# Patient Record
Sex: Male | Born: 1995 | Race: White | Hispanic: No | Marital: Single | Smoking: Current every day smoker
Health system: Southern US, Community
[De-identification: ages and names within clinical notes are randomized; demographics above are authoritative.]

## PROBLEM LIST (undated history)

## (undated) DIAGNOSIS — F419 Anxiety disorder, unspecified: Secondary | ICD-10-CM

## (undated) DIAGNOSIS — T7840XA Allergy, unspecified, initial encounter: Secondary | ICD-10-CM

## (undated) HISTORY — DX: Allergy, unspecified, initial encounter: T78.40XA

## (undated) HISTORY — PX: INNER EAR SURGERY: SHX679

## (undated) HISTORY — DX: Anxiety disorder, unspecified: F41.9

---
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
## 2017-03-22 DIAGNOSIS — K5909 Other constipation: Secondary | ICD-10-CM | POA: Diagnosis not present

## 2017-03-22 DIAGNOSIS — K625 Hemorrhage of anus and rectum: Secondary | ICD-10-CM | POA: Diagnosis not present

## 2018-01-03 ENCOUNTER — Ambulatory Visit
Admission: RE | Admit: 2018-01-03 | Discharge: 2018-01-03 | Disposition: A | Discharge status: Home / Self Care | Payer: BLUE CROSS/BLUE SHIELD | Source: Ambulatory Visit | Attending: Student | Admitting: Student

## 2018-01-03 ENCOUNTER — Other Ambulatory Visit: Payer: Self-pay | Admitting: Student

## 2018-01-03 DIAGNOSIS — K5900 Constipation, unspecified: Secondary | ICD-10-CM

## 2018-02-06 ENCOUNTER — Encounter: Payer: Self-pay | Admitting: Physician Assistant

## 2018-02-06 ENCOUNTER — Ambulatory Visit (INDEPENDENT_AMBULATORY_CARE_PROVIDER_SITE_OTHER): Payer: BLUE CROSS/BLUE SHIELD | Admitting: Physician Assistant

## 2018-02-06 VITALS — BP 130/100 | HR 80 | Temp 98.1°F | Resp 20 | Ht 71.0 in | Wt 227.0 lb

## 2018-02-06 DIAGNOSIS — Z9109 Other allergy status, other than to drugs and biological substances: Secondary | ICD-10-CM | POA: Insufficient documentation

## 2018-02-06 DIAGNOSIS — F419 Anxiety disorder, unspecified: Secondary | ICD-10-CM | POA: Insufficient documentation

## 2018-02-06 DIAGNOSIS — G43109 Migraine with aura, not intractable, without status migrainosus: Secondary | ICD-10-CM | POA: Insufficient documentation

## 2018-02-06 DIAGNOSIS — K5901 Slow transit constipation: Secondary | ICD-10-CM | POA: Insufficient documentation

## 2018-02-06 DIAGNOSIS — R030 Elevated blood-pressure reading, without diagnosis of hypertension: Secondary | ICD-10-CM | POA: Insufficient documentation

## 2018-02-06 NOTE — Progress Notes (Signed)
Patient: Bradley Webb, Male    DOB: 06/25/1995, 22 y.o.   MRN: [id number] Visit Date: 02/06/2018  Today's Provider: Margaretann LovelessJennifer M Cj Beecher, PA-C   Chief Complaint  Patient presents with  . New Patient (Initial Visit)   Subjective:    Annual physical exam Bradley Webb is a 22 y.o. male who presents today to establish care. [facility] His mother is a patient here.   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 There was immense scarring and he has had multiple ear surgeries and attempted grafts twice that failed. He is still followed by audiology prn.  He continues to have issues with constipation. This causes a lot of his abdominal pain, bloating and occasional rectal bleeding when severely constipated. He has seen GI in the past for this. He is currently using colace daily, miralax prn.   He also complains of occasional headaches. They occur approximately once per month. He describes them as sharp, intense pain over the right eye. They are preceeded by flashes of light in the right eye. He also describes occasional nausea and dizziness with them. He is able to take something OTC for them and gets relief.   He also suffers from environmental allergies. They do effect him year round. They cause eye itching, sinus pressure, rhinorrhea, sneezing and cough. He will take something OTC intermittently.  He also does have anxiety. This has been treated and is stable currently. He is on Lexapro 5mg . No complaints.  He does smoke cigarettes, about 1/2 ppd. He also drinks alcohol. Reports to drinking 1-2 beers every other night. He does not use illicit drugs.  -----------------------------------------------------------------   Review of Systems  Constitutional: Positive for diaphoresis.  HENT: Positive for congestion, hearing loss and sinus pressure.   Eyes:  Positive for photophobia and itching.  Respiratory: Positive for cough and shortness of breath.   Cardiovascular: Negative.   Gastrointestinal: Positive for abdominal distention, abdominal pain, anal bleeding, blood in stool, constipation, nausea and rectal pain.  Endocrine: Positive for polydipsia and polyuria.  Genitourinary: Negative.   Musculoskeletal: Positive for back pain.  Skin: Negative.   Allergic/Immunologic: Positive for environmental allergies.  Neurological: Positive for dizziness, light-headedness, numbness and headaches.  Hematological: Negative.   Psychiatric/Behavioral: Positive for agitation, behavioral problems and dysphoric mood. The patient is nervous/anxious.     Social History      He  reports that he has been smoking cigarettes. He has been smoking about 0.50 packs per day. He has never used smokeless tobacco. He reports that he drinks about 9.0 standard drinks of alcohol per week. He reports that he has current or past drug history.       Social History   Socioeconomic History  . Marital status: Single    Spouse name: Not on file  . Number of children: Not on file  . Years of education: Not on file  . Highest education level: Not on file  Occupational History  . Not on file  Social Needs  . Financial resource strain: Not on file  . Food insecurity:    Worry: Not on file    Inability: Not on file  . Transportation needs:    Medical: Not on file    Non-medical: Not on file  Tobacco Use  . Smoking status: Current Every Day Smoker    Packs/day:  0.50    Types: Cigarettes  . Smokeless tobacco: Never Used  Substance and Sexual Activity  . Alcohol use: Yes    Alcohol/week: 9.0 standard drinks    Types: 9 Cans of beer per week  . Drug use: Yes  . Sexual activity: Not on file  Lifestyle  . Physical activity:    Days per week: Not on file    Minutes per session: Not on file  . Stress: Not on file  Relationships  . Social connections:    Talks on  phone: Not on file    Gets together: Not on file    Attends religious service: Not on file    Active member of club or organization: Not on file    Attends meetings of clubs or organizations: Not on file    Relationship status: Not on file  Other Topics Concern  . Not on file  Social History Narrative  . Not on file    Past Medical History:  Diagnosis Date  . Allergy   . Anxiety      There are no active problems to display for this patient.   Past Surgical History:  Procedure Laterality Date  . INNER EAR SURGERY      Family History        Family Status  Relation Name Status  . Mother  Alive        His family history includes Anxiety disorder in his mother; Depression in his mother; Hypertension in his mother.      Allergies  Allergen Reactions  . Codeine      Current Outpatient Medications:  .  docusate sodium (COLACE) 100 MG capsule, Take by mouth., Disp: , Rfl:  .  escitalopram (LEXAPRO) 5 MG tablet, TAKE 1 TABLET BY MOUTH EVERY DAY, Disp: , Rfl:  .  ibuprofen (ADVIL,MOTRIN) 800 MG tablet, Take 1 tablet (800 mg total) by mouth every 8 (eight) hours as needed for moderate pain., Disp: 21 tablet, Rfl: 0   Patient Care Team: Margaretann LovelessBurnette, Schelly Chuba M, PA-C as PCP - General (Family Medicine)      Objective:   Vitals: BP (!) 130/100 (BP Location: Left Arm, Patient Position: Sitting, Cuff Size: Large)   Pulse 80   Temp 98.1 F (36.7 C) (Oral)   Resp 20   Ht 5\' 11"  (1.803 m)   Wt 227 lb (103 kg)   SpO2 98%   BMI 31.66 kg/m    Vitals:   02/06/18 1442  BP: (!) 130/100  Pulse: 80  Resp: 20  Temp: 98.1 F (36.7 C)  TempSrc: Oral  SpO2: 98%  Weight: 227 lb (103 kg)  Height: 5\' 11"  (1.803 m)     Physical Exam  Constitutional: He is oriented to person, place, and time. He appears well-developed and well-nourished.  HENT:  Head: Normocephalic and atraumatic.  Right Ear: Hearing, tympanic membrane, external ear and ear canal normal.  Left Ear: External  ear normal. Tympanic membrane is scarred. Decreased hearing is noted.  Nose: Nose normal.  Mouth/Throat: Uvula is midline, oropharynx is clear and moist and mucous membranes are normal.  Eyes: Pupils are equal, round, and reactive to light. Conjunctivae and EOM are normal. Right eye exhibits no discharge.  Neck: Normal range of motion. Neck supple. No tracheal deviation present. No thyromegaly present.  Cardiovascular: Normal rate, regular rhythm, normal heart sounds and intact distal pulses.  No murmur heard. Pulmonary/Chest: Effort normal and breath sounds normal. No respiratory distress. He has no wheezes. He  has no rales. He exhibits no tenderness.  Abdominal: Soft. He exhibits no distension and no mass. There is no tenderness. There is no rebound and no guarding.  Musculoskeletal: Normal range of motion. He exhibits no edema or tenderness.  Lymphadenopathy:    He has no cervical adenopathy.  Neurological: He is alert and oriented to person, place, and time. He has normal reflexes. He displays normal reflexes. No cranial nerve deficit. He exhibits normal muscle tone. Coordination normal.  Skin: Skin is warm and dry. No rash noted. No erythema.  Psychiatric: He has a normal mood and affect. His behavior is normal. Judgment and thought content normal.     Depression Screen PHQ 2/9 Scores 02/06/2018  PHQ - 2 Score 2  PHQ- 9 Score 2      Assessment & Plan:     Routine Health Maintenance and Physical Exam  Exercise Activities and Dietary recommendations Goals   None      There is no immunization history on file for this patient.  Health Maintenance  Topic Date Due  . HIV Screening  07/27/2012  . TETANUS/TDAP  07/27/2016  . INFLUENZA VACCINE  01/17/2018     Discussed health benefits of physical activity, and encouraged him to engage in regular exercise appropriate for his age and condition.    1. Elevated blood pressure reading BP today is elevated. Possibly due to  meeting new provider. Mother has BP issues. I will see him back in 4 weeks to recheck BP.   2. Slow transit constipation Continue colace. Discussed dietary fibers and probiotics as well. Use miralax prn. Call if worsening. Discussed medications such as Linzess, amitiza, etc.  3. Migraine with aura and without status migrainosus, not intractable Able to break with OTC medications. Discussed magnesium oxide 1000 mg daily. May help constipation as well as migraines. Call if headaches worsen or become more frequent.   4. Environmental allergies Continue OTC treatment.   5. Anxiety Stable on Lexapro 5mg .   --------------------------------------------------------------------    Margaretann Loveless, PA-C  [facility]

## 2018-02-06 NOTE — Patient Instructions (Signed)

## 2018-03-19 ENCOUNTER — Encounter: Payer: Self-pay | Admitting: Physician Assistant

## 2018-03-19 ENCOUNTER — Ambulatory Visit (INDEPENDENT_AMBULATORY_CARE_PROVIDER_SITE_OTHER): Payer: BLUE CROSS/BLUE SHIELD | Admitting: Physician Assistant

## 2018-03-19 VITALS — BP 130/90 | HR 82 | Temp 98.5°F | Resp 16 | Ht 71.0 in | Wt 230.0 lb

## 2018-03-19 DIAGNOSIS — I10 Essential (primary) hypertension: Secondary | ICD-10-CM

## 2018-03-19 DIAGNOSIS — Z2821 Immunization not carried out because of patient refusal: Secondary | ICD-10-CM

## 2018-03-19 DIAGNOSIS — Z6832 Body mass index (BMI) 32.0-32.9, adult: Secondary | ICD-10-CM

## 2018-03-19 MED ORDER — LISINOPRIL 10 MG PO TABS: 10.0000 mg | ORAL_TABLET | Freq: Every day | ORAL | 1 refills | Status: DC

## 2018-03-19 NOTE — Progress Notes (Signed)
       Patient: Bradley Webb Male    DOB: [birth year]   22 y.o.   MRN: [id number] Visit Date: 03/19/2018  Today's Provider: Margaretann Loveless, PA-C   Chief Complaint  Patient presents with  . Follow-up    Elevated Blood Pressure.   Subjective:    HPI Patient here today for 4 week follow-up. Patient last office visit was 130/100. Patient reports that he stopped the energy drinks completely. Diet in general is "unhealthy". Reports exercising. Patient denies chest pain, leg swelling, visual disturbance, dizziness or lightheaded.    Allergies  Allergen Reactions  . Codeine      Current Outpatient Medications:  .  docusate sodium (COLACE) 100 MG capsule, Take by mouth., Disp: , Rfl:  .  escitalopram (LEXAPRO) 5 MG tablet, TAKE 1 TABLET BY MOUTH EVERY DAY, Disp: , Rfl:  .  ibuprofen (ADVIL,MOTRIN) 800 MG tablet, Take 1 tablet (800 mg total) by mouth every 8 (eight) hours as needed for moderate pain. (Patient not taking: Reported on 03/19/2018), Disp: 21 tablet, Rfl: 0  Review of Systems  Constitutional: Negative for fatigue.  Eyes: Negative for visual disturbance.  Respiratory: Negative for chest tightness and shortness of breath.   Cardiovascular: Negative for chest pain, palpitations and leg swelling.  Neurological: Negative for dizziness, light-headedness and headaches.    Social History   Tobacco Use  . Smoking status: Current Every Day Smoker    Packs/day: 0.50    Types: Cigarettes  . Smokeless tobacco: Never Used  Substance Use Topics  . Alcohol use: Yes    Alcohol/week: 9.0 standard drinks    Types: 9 Cans of beer per week   Objective:   BP 130/90 (BP Location: Right Arm, Patient Position: Sitting, Cuff Size: Large)   Pulse 82   Temp 98.5 F (36.9 C) (Oral)   Resp 16   Ht 5\' 11"  (1.803 m)   Wt 230 lb (104.3 kg)   BMI 32.08 kg/m  Vitals:   03/19/18 1304  BP: 130/90  Pulse: 82  Resp: 16  Temp: 98.5 F (36.9 C)  TempSrc: Oral  Weight: 230 lb (104.3  kg)  Height: 5\' 11"  (1.803 m)     Physical Exam  Constitutional: He appears well-developed and well-nourished. No distress.  HENT:  Head: Normocephalic and atraumatic.  Neck: Normal range of motion. Neck supple.  Cardiovascular: Normal rate, regular rhythm and normal heart sounds. Exam reveals no gallop and no friction rub.  No murmur heard. Pulmonary/Chest: Effort normal and breath sounds normal. No respiratory distress. He has no wheezes. He has no rales.  Musculoskeletal: He exhibits no edema.  Skin: He is not diaphoretic.  Vitals reviewed.       Assessment & Plan:     1. Essential hypertension Will start lisinopril as below for HTN. I will see him back in 4-6 weeks to see how he is doing with the medication.  - lisinopril (PRINIVIL,ZESTRIL) 10 MG tablet; Take 1 tablet (10 mg total) by mouth daily.  Dispense: 30 tablet; Refill: 1  2. BMI 32.0-32.9,adult Counseled patient on healthy lifestyle modifications including dieting and exercise.   3. Influenza vaccination declined       Margaretann Loveless, PA-C  [facility]

## 2018-03-19 NOTE — Patient Instructions (Signed)
Hypertension Hypertension is another name for high blood pressure. High blood pressure forces your heart to work harder to pump blood. This can cause problems over time. There are two numbers in a blood pressure reading. There is a top number (systolic) over a bottom number (diastolic). It is best to have a blood pressure below 120/80. Healthy choices can help lower your blood pressure. You may need medicine to help lower your blood pressure if:  Your blood pressure cannot be lowered with healthy choices.  Your blood pressure is higher than 130/80.  Follow these instructions at home: Eating and drinking  If directed, follow the DASH eating plan. This diet includes: ? Filling half of your plate at each meal with fruits and vegetables. ? Filling one quarter of your plate at each meal with whole grains. Whole grains include whole wheat pasta, brown rice, and whole grain bread. ? Eating or drinking low-fat dairy products, such as skim milk or low-fat yogurt. ? Filling one quarter of your plate at each meal with low-fat (lean) proteins. Low-fat proteins include fish, skinless chicken, eggs, beans, and tofu. ? Avoiding fatty meat, cured and processed meat, or chicken with skin. ? Avoiding premade or processed food.  Eat less than 1,500 mg of salt (sodium) a day.  Limit alcohol use to no more than 1 drink a day for nonpregnant women and 2 drinks a day for men. One drink equals 12 oz of beer, 5 oz of wine, or 1 oz of hard liquor. Lifestyle  Work with your doctor to stay at a healthy weight or to lose weight. Ask your doctor what the best weight is for you.  Get at least 30 minutes of exercise that causes your heart to beat faster (aerobic exercise) most days of the week. This may include walking, swimming, or biking.  Get at least 30 minutes of exercise that strengthens your muscles (resistance exercise) at least 3 days a week. This may include lifting weights or pilates.  Do not use any  products that contain nicotine or tobacco. This includes cigarettes and e-cigarettes. If you need help quitting, ask your doctor.  Check your blood pressure at home as told by your doctor.  Keep all follow-up visits as told by your doctor. This is important. Medicines  Take over-the-counter and prescription medicines only as told by your doctor. Follow directions carefully.  Do not skip doses of blood pressure medicine. The medicine does not work as well if you skip doses. Skipping doses also puts you at risk for problems.  Ask your doctor about side effects or reactions to medicines that you should watch for. Contact a doctor if:  You think you are having a reaction to the medicine you are taking.  You have headaches that keep coming back (recurring).  You feel dizzy.  You have swelling in your ankles.  You have trouble with your vision. Get help right away if:  You get a very bad headache.  You start to feel confused.  You feel weak or numb.  You feel faint.  You get very bad pain in your: ? Chest. ? Belly (abdomen).  You throw up (vomit) more than once.  You have trouble breathing. Summary  Hypertension is another name for high blood pressure.  Making healthy choices can help lower blood pressure. If your blood pressure cannot be controlled with healthy choices, you may need to take medicine. This information is not intended to replace advice given to you by your health care   provider. Make sure you discuss any questions you have with your health care provider. Document Released: 11/22/2007 Document Revised: 05/03/2016 Document Reviewed: 05/03/2016 Elsevier Interactive Patient Education  2018 [employer]. Lisinopril tablets What is this medicine? LISINOPRIL (lyse IN oh pril) is an ACE inhibitor. This medicine is used to treat high blood pressure and heart failure. It is also used to protect the heart immediately after a heart attack. This medicine may be used  for other purposes; ask your health care provider or pharmacist if you have questions. COMMON BRAND NAME(S): Prinivil, Zestril What should I tell my health care provider before I take this medicine? They need to know if you have any of these conditions: -diabetes -heart or blood vessel disease -kidney disease -low blood pressure -previous swelling of the tongue, face, or lips with difficulty breathing, difficulty swallowing, hoarseness, or tightening of the throat -an unusual or allergic reaction to lisinopril, other ACE inhibitors, insect venom, foods, dyes, or preservatives -pregnant or trying to get pregnant -breast-feeding How should I use this medicine? Take this medicine by mouth with a glass of water. Follow the directions on your prescription label. You may take this medicine with or without food. If it upsets your stomach, take it with food. Take your medicine at regular intervals. Do not take it more often than directed. Do not stop taking except on your doctor's advice. Talk to your pediatrician regarding the use of this medicine in children. Special care may be needed. While this drug may be prescribed for children as young as 47 years of age for selected conditions, precautions do apply. Overdosage: If you think you have taken too much of this medicine contact a poison control center or emergency room at once. NOTE: This medicine is only for you. Do not share this medicine with others. What if I miss a dose? If you miss a dose, take it as soon as you can. If it is almost time for your next dose, take only that dose. Do not take double or extra doses. What may interact with this medicine? Do not take this medicine with any of the following medications: -hymenoptera venom -sacubitril; valsartan This medicines may also interact with the following medications: -aliskiren -angiotensin receptor blockers, like losartan or valsartan -certain medicines for  diabetes -diuretics -everolimus -gold compounds -lithium -NSAIDs, medicines for pain and inflammation, like ibuprofen or naproxen -potassium salts or supplements -salt substitutes -sirolimus -temsirolimus This list may not describe all possible interactions. Give your health care provider a list of all the medicines, herbs, non-prescription drugs, or dietary supplements you use. Also tell them if you smoke, drink alcohol, or use illegal drugs. Some items may interact with your medicine. What should I watch for while using this medicine? Visit your doctor or health care professional for regular check ups. Check your blood pressure as directed. Ask your doctor what your blood pressure should be, and when you should contact him or her. Do not treat yourself for coughs, colds, or pain while you are using this medicine without asking your doctor or health care professional for advice. Some ingredients may increase your blood pressure. Women should inform their doctor if they wish to become pregnant or think they might be pregnant. There is a potential for serious side effects to an unborn child. Talk to your health care professional or pharmacist for more information. Check with your doctor or health care professional if you get an attack of severe diarrhea, nausea and vomiting, or if you sweat a  lot. The loss of too much body fluid can make it dangerous for you to take this medicine. You may get drowsy or dizzy. Do not drive, use machinery, or do anything that needs mental alertness until you know how this drug affects you. Do not stand or sit up quickly, especially if you are an older patient. This reduces the risk of dizzy or fainting spells. Alcohol can make you more drowsy and dizzy. Avoid alcoholic drinks. Avoid salt substitutes unless you are told otherwise by your doctor or health care professional. What side effects may I notice from receiving this medicine? Side effects that you should report  to your doctor or health care professional as soon as possible: -allergic reactions like skin rash, itching or hives, swelling of the hands, feet, face, lips, throat, or tongue -breathing problems -signs and symptoms of kidney injury like trouble passing urine or change in the amount of urine -signs and symptoms of increased potassium like muscle weakness; chest pain; or fast, irregular heartbeat -signs and symptoms of liver injury like dark yellow or brown urine; general ill feeling or flu-like symptoms; light-colored stools; loss of appetite; nausea; right upper belly pain; unusually weak or tired; yellowing of the eyes or skin -signs and symptoms of low blood pressure like dizziness; feeling faint or lightheaded, falls; unusually weak or tired -stomach pain with or without nausea and vomiting Side effects that usually do not require medical attention (report to your doctor or health care professional if they continue or are bothersome): -changes in taste -cough -dizziness -fever -headache -sensitivity to light This list may not describe all possible side effects. Call your doctor for medical advice about side effects. You may report side effects to FDA at 1-800-FDA-1088. Where should I keep my medicine? Keep out of the reach of children. Store at room temperature between 15 and 30 degrees C (59 and 86 degrees F). Protect from moisture. Keep container tightly closed. Throw away any unused medicine after the expiration date. NOTE: This sheet is a summary. It may not cover all possible information. If you have questions about this medicine, talk to your doctor, pharmacist, or health care provider.  2018 Elsevier/Gold Standard (2015-07-26 12:52:35)
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
## 2018-04-03 ENCOUNTER — Other Ambulatory Visit: Payer: Self-pay | Admitting: Physician Assistant

## 2018-04-03 DIAGNOSIS — I10 Essential (primary) hypertension: Secondary | ICD-10-CM

## 2018-04-03 MED ORDER — LISINOPRIL 10 MG PO TABS: 10.0000 mg | ORAL_TABLET | Freq: Every day | ORAL | 1 refills | Status: DC

## 2018-04-03 NOTE — Addendum Note (Signed)
Addended by: Margaretann Loveless on: 04/03/2018 11:48 AM   Modules accepted: Orders

## 2018-05-01 ENCOUNTER — Ambulatory Visit: Payer: Self-pay | Admitting: Physician Assistant

## 2018-05-01 NOTE — Progress Notes (Deleted)
       Patient: Bradley Webb Male    DOB: 03/10/1996   22 y.o.   MRN: [id number] Visit Date: 05/01/2018  Today's Provider: Margaretann LovelessJennifer M Burnette, PA-C   No chief complaint on file.  Subjective:    HPI  Hypertension, follow-up:  BP Readings from Last 3 Encounters:  03/19/18 130/90  02/06/18 (!) 130/100  10/08/15 126/76    He was last seen for hypertension 6 weeks ago.  BP at that visit was 130/100. Management since that visit includes start Lisinopril. He reports {excellent/good/fair/poor:19665} compliance with treatment. He {ACTION; IS/IS [id number]} having side effects. *** He {is/is not:9024} exercising. He {is/is not:9024} adherent to low salt diet.   Outside blood pressures are ***. He is experiencing {Symptoms; cardiac:12860}.  Patient denies {Symptoms; cardiac:12860}.   Cardiovascular risk factors include {cv risk factors:510}.  Use of agents associated with hypertension: {bp agents assoc with hypertension:511::"none"}.     Weight trend: {trend:16658} Wt Readings from Last 3 Encounters:  03/19/18 230 lb (104.3 kg)  02/06/18 227 lb (103 kg)  10/08/15 260 lb (117.9 kg) (>99 %, Z= 2.60)*   * Growth percentiles are based on CDC (Boys, 2-20 Years) data.    Current diet: {diet habits:16563}  ------------------------------------------------------------------------     Allergies  Allergen Reactions  . Codeine      Current Outpatient Medications:  .  docusate sodium (COLACE) 100 MG capsule, Take by mouth., Disp: , Rfl:  .  ibuprofen (ADVIL,MOTRIN) 800 MG tablet, Take 1 tablet (800 mg total) by mouth every 8 (eight) hours as needed for moderate pain. (Patient not taking: Reported on 03/19/2018), Disp: 21 tablet, Rfl: 0 .  lisinopril (PRINIVIL,ZESTRIL) 10 MG tablet, Take 1 tablet (10 mg total) by mouth daily., Disp: 90 tablet, Rfl: 1  Review of Systems  Social History   Tobacco Use  . Smoking status: Current Every Day Smoker    Packs/day: 0.50   Types: Cigarettes  . Smokeless tobacco: Never Used  Substance Use Topics  . Alcohol use: Yes    Alcohol/week: 9.0 standard drinks    Types: 9 Cans of beer per week   Objective:   There were no vitals taken for this visit. There were no vitals filed for this visit.   Physical Exam      Assessment & Plan:           Margaretann LovelessJennifer M Burnette, PA-C  [facility]

## 2018-08-07 ENCOUNTER — Encounter: Payer: Self-pay | Admitting: Physician Assistant

## 2018-08-07 ENCOUNTER — Ambulatory Visit (INDEPENDENT_AMBULATORY_CARE_PROVIDER_SITE_OTHER): Payer: Self-pay | Admitting: Physician Assistant

## 2018-08-07 VITALS — BP 118/88 | HR 92 | Temp 98.4°F | Resp 16 | Wt 229.6 lb

## 2018-08-07 DIAGNOSIS — H60392 Other infective otitis externa, left ear: Secondary | ICD-10-CM

## 2018-08-07 DIAGNOSIS — H66002 Acute suppurative otitis media without spontaneous rupture of ear drum, left ear: Secondary | ICD-10-CM

## 2018-08-07 MED ORDER — AMOXICILLIN 875 MG PO TABS: 875.0000 mg | ORAL_TABLET | Freq: Two times a day (BID) | ORAL | 0 refills | Status: DC

## 2018-08-07 MED ORDER — TRIAMCINOLONE ACETONIDE 0.1 % EX CREA: 1.0000 "application " | TOPICAL_CREAM | Freq: Two times a day (BID) | CUTANEOUS | 0 refills | Status: AC

## 2018-08-07 NOTE — Progress Notes (Signed)
Patient: Bradley Webb Male    DOB: 05/23/1996   23 y.o.   MRN: [id number] Visit Date: 08/07/2018  Today's Provider: Margaretann Loveless, PA-C   Chief Complaint  Patient presents with  . Otalgia   Subjective:     Otalgia   There is pain in the left ear. This is a new (Saturday night it when it started worsening Sunday morning) problem. The current episode started in the past 7 days. The problem occurs every few hours. The problem has been gradually worsening. There has been no fever. The pain is at a severity of 2/10. Associated symptoms include ear discharge and hearing loss ("wears hearing aid"; not worse than normal). Pertinent negatives include no coughing, headaches, rhinorrhea or sore throat. Treatments tried: Home remedy. The treatment provided mild relief. His past medical history is significant for hearing loss (Left ear).    Allergies  Allergen Reactions  . Codeine      Current Outpatient Medications:  .  docusate sodium (COLACE) 100 MG capsule, Take by mouth., Disp: , Rfl:  .  ibuprofen (ADVIL,MOTRIN) 800 MG tablet, Take 1 tablet (800 mg total) by mouth every 8 (eight) hours as needed for moderate pain. (Patient not taking: Reported on 03/19/2018), Disp: 21 tablet, Rfl: 0 .  lisinopril (PRINIVIL,ZESTRIL) 10 MG tablet, Take 1 tablet (10 mg total) by mouth daily. (Patient not taking: Reported on 08/07/2018), Disp: 90 tablet, Rfl: 1  Review of Systems  Constitutional: Negative for fatigue and fever.  HENT: Positive for congestion, ear discharge, ear pain and hearing loss ("wears hearing aid"; not worse than normal). Negative for postnasal drip, rhinorrhea and sore throat.   Respiratory: Negative for cough.   Cardiovascular: Negative for chest pain.  Neurological: Negative for headaches.    Social History   Tobacco Use  . Smoking status: Current Every Day Smoker    Packs/day: 0.50    Types: Cigarettes  . Smokeless tobacco: Never Used  Substance Use Topics  .  Alcohol use: Yes    Alcohol/week: 9.0 standard drinks    Types: 9 Cans of beer per week      Objective:   BP 118/88 (BP Location: Left Arm, Patient Position: Sitting, Cuff Size: Large)   Pulse 92   Temp 98.4 F (36.9 C) (Oral)   Resp 16   Wt 229 lb 9.6 oz (104.1 kg)   BMI 32.02 kg/m  Vitals:   08/07/18 0933  BP: 118/88  Pulse: 92  Resp: 16  Temp: 98.4 F (36.9 C)  TempSrc: Oral  Weight: 229 lb 9.6 oz (104.1 kg)     Physical Exam Vitals signs reviewed.  Constitutional:      General: He is not in acute distress.    Appearance: He is well-developed. He is obese. He is not diaphoretic.  HENT:     Head: Normocephalic and atraumatic.     Right Ear: Hearing, ear canal and external ear normal. No middle ear effusion. Tympanic membrane is scarred (small scar noted in 10-12 o'clock position). Tympanic membrane is not erythematous or bulging.     Left Ear: Ear canal and external ear normal. Decreased hearing (chronic) noted. Swelling and tenderness present. A middle ear effusion (fluid (air bubbles) noted in the visible portion of the TM) is present. Tympanic membrane is scarred (over half of the TM scarred down) and erythematous. Tympanic membrane is not bulging.     Ears:      Nose: Mucosal edema and rhinorrhea  present.     Right Sinus: No maxillary sinus tenderness or frontal sinus tenderness.     Left Sinus: No maxillary sinus tenderness or frontal sinus tenderness.     Mouth/Throat:     Pharynx: Uvula midline. No oropharyngeal exudate or posterior oropharyngeal erythema.  Eyes:     General:        Right eye: No discharge.        Left eye: No discharge.     Conjunctiva/sclera: Conjunctivae normal.     Pupils: Pupils are equal, round, and reactive to light.  Neck:     Musculoskeletal: Normal range of motion and neck supple.     Thyroid: No thyromegaly.     Trachea: No tracheal deviation.     Meningeal: Brudzinski's sign and Kernig's sign absent.  Cardiovascular:      Rate and Rhythm: Normal rate and regular rhythm.     Heart sounds: Normal heart sounds. No murmur. No friction rub. No gallop.   Pulmonary:     Effort: Pulmonary effort is normal. No respiratory distress.     Breath sounds: Normal breath sounds. No stridor. No wheezing or rales.  Lymphadenopathy:     Cervical: No cervical adenopathy.  Skin:    General: Skin is warm and dry.  Neurological:     Mental Status: He is alert.        Assessment & Plan    1. Non-recurrent acute suppurative otitis media of left ear without spontaneous rupture of tympanic membrane Due to history and appearance I will treat with amoxil as below. Add flonase for fluid and inflammation. Push fluids. Avoid hearing aid until external ear is improved. Triamcinolone was also sent in for the redness and flaking of the external ear as noted below. Call if worsening.  - amoxicillin (AMOXIL) 875 MG tablet; Take 1 tablet (875 mg total) by mouth 2 (two) times daily.  Dispense: 20 tablet; Refill: 0  2. Other infective acute otitis externa of left ear See above medical treatment plan. - triamcinolone cream (KENALOG) 0.1 %; Apply 1 application topically 2 (two) times daily.  Dispense: 30 g; Refill: 0     Margaretann Loveless, PA-C  [facility]

## 2018-10-14 ENCOUNTER — Telehealth: Payer: Self-pay

## 2018-10-14 NOTE — Telephone Encounter (Signed)
Patient was advised.  

## 2018-10-14 NOTE — Telephone Encounter (Signed)
He may need appt to have ear drum evaluated.

## 2018-10-14 NOTE — Telephone Encounter (Signed)
Pt was seen in the office for a left ear infection 08/07/2018.  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  He states he is still having trouble with ear drainage.  He reports the drainage does have an odor to it.  He has been using Flonase regularly for allergies.  Pt is wanting to know if he needs an antibiotic/ear drops sent into [facility] in [place], or an office visit.      Thanks,   -Vernona Rieger

## 2018-10-15 ENCOUNTER — Encounter: Payer: Self-pay | Admitting: Physician Assistant

## 2018-10-15 ENCOUNTER — Ambulatory Visit (INDEPENDENT_AMBULATORY_CARE_PROVIDER_SITE_OTHER): Payer: Self-pay | Admitting: Physician Assistant

## 2018-10-15 ENCOUNTER — Other Ambulatory Visit: Payer: Self-pay

## 2018-10-15 VITALS — BP 150/89 | HR 69 | Temp 98.9°F | Wt 236.0 lb

## 2018-10-15 DIAGNOSIS — H9212 Otorrhea, left ear: Secondary | ICD-10-CM
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
## 2018-10-16 ENCOUNTER — Encounter: Payer: Self-pay | Admitting: Physician Assistant

## 2018-10-16 NOTE — Patient Instructions (Signed)

## 2019-03-05 ENCOUNTER — Other Ambulatory Visit: Payer: Self-pay

## 2019-03-05 DIAGNOSIS — Z20822 Contact with and (suspected) exposure to covid-19: Secondary | ICD-10-CM

## 2019-03-06 LAB — NOVEL CORONAVIRUS, NAA: SARS-CoV-2, NAA: NOT DETECTED

## 2020-02-16 ENCOUNTER — Inpatient Hospital Stay: Payer: Self-pay | Admitting: Physician Assistant

## 2020-02-16 NOTE — Progress Notes (Deleted)
     Established patient visit   Patient: Bradley Webb   DOB: 1995-07-12   24 y.o. Male  MRN: [id number] Visit Date: 02/16/2020  Today's healthcare provider: Margaretann Loveless, PA-C   No chief complaint on file.  Subjective    HPI   The patient is a 24 year old male who presents for follow up after ER visit in [place], [place].  Patient was referred for inpatient mental health treatment for bipolar disorder, first episode mania with psychotic features.  He was started on Zyprexa in the ER.   He was also seen and treated for an elevated blood pressure.  In the ER his pressure was recorded at 147/107.  For this he was given Lisinopril.   {Show patient history (optional):23778::" "}   Medications: Outpatient Medications Prior to Visit  Medication Sig  . amoxicillin (AMOXIL) 875 MG tablet Take 1 tablet (875 mg total) by mouth 2 (two) times daily. (Patient not taking: Reported on 10/15/2018)  . docusate sodium (COLACE) 100 MG capsule Take by mouth.  . triamcinolone cream (KENALOG) 0.1 % Apply 1 application topically 2 (two) times daily.   No facility-administered medications prior to visit.    Review of Systems  {Heme  Chem  Endocrine  Serology  Results Review (optional):23779::" "}  Objective    There were no vitals taken for this visit. {Show previous vital signs (optional):23777::" "}  Physical Exam  ***  No results found for any visits on 02/16/20.  Assessment & Plan     ***  No follow-ups on file.      {provider attestation***:1}   Reine Just  [facility] [phone] (phone) [phone] (fax)  [facility]

## 2020-02-25 ENCOUNTER — Ambulatory Visit (INDEPENDENT_AMBULATORY_CARE_PROVIDER_SITE_OTHER): Payer: Self-pay | Admitting: Physician Assistant

## 2020-02-25 ENCOUNTER — Other Ambulatory Visit: Payer: Self-pay

## 2020-02-25 ENCOUNTER — Encounter: Payer: Self-pay | Admitting: Physician Assistant

## 2020-02-25 VITALS — BP 120/70 | HR 78 | Temp 99.0°F | Resp 16 | Wt 219.0 lb

## 2020-02-25 DIAGNOSIS — F23 Brief psychotic disorder: Secondary | ICD-10-CM | POA: Insufficient documentation

## 2020-02-25 DIAGNOSIS — F309 Manic episode, unspecified: Secondary | ICD-10-CM

## 2020-02-25 HISTORY — DX: Brief psychotic disorder: F23

## 2020-02-25 MED ORDER — MELATONIN 10 MG PO TABS: 10.0000 mg | ORAL_TABLET | Freq: Every day | ORAL | 0 refills | Status: AC

## 2020-02-25 MED ORDER — OLANZAPINE 10 MG PO TABS: 10.0000 mg | ORAL_TABLET | Freq: Every day | ORAL | 0 refills | Status: DC

## 2020-02-25 MED ORDER — LITHIUM CARBONATE ER 450 MG PO TBCR: 450.0000 mg | EXTENDED_RELEASE_TABLET | Freq: Every day | ORAL | 0 refills | Status: DC

## 2020-02-25 NOTE — Progress Notes (Signed)
Established patient visit   Patient: Bradley Webb   DOB: 10-16-1995   24 y.o. Male  MRN: [id number] Visit Date: 02/25/2020  Today's healthcare provider: Margaretann Loveless, PA-C   No chief complaint on file.  Subjective    HPI  Follow up ER visit  Patient was seen in ER at [facility]  For psychiatric evaluation/ manic episode on 01/17/20. He was treated for psychiatric episode such as mania . Treatment for this included starting patient on Zyprexa 10mg  and Ativan 2mg . He reports good compliance with treatment. He reports this condition is Worse.Patient reports symptoms of depression are worse, he denies suicidal thoughts or ideation, patient denies crying episodes or dysphoric moods. Patient reports since starting medication he has been more tired and sleeping more throughout his day.  Patient states that since his ED visit he has been seen twice at [facility].Patients mother is accompanied with him today who states that patient cannot get in with psychiatry until [place]. Patient was started on Lithium on 8/5 at [facility] (was IVC x 14 days), Zyprexa was increased to 15mg  and Lithium was prescribed to help with sleeping. Patients mother reports lithium "drains him ". Patient states that he would like to discuss lowering doses and possibly trying to see if he can get off medications. -----------------------------------------------------------------------------------------      Medications: Outpatient Medications Prior to Visit  Medication Sig  . amoxicillin (AMOXIL) 875 MG tablet Take 1 tablet (875 mg total) by mouth 2 (two) times daily. (Patient not taking: Reported on 10/15/2018)  . docusate sodium (COLACE) 100 MG capsule Take by mouth.  . triamcinolone cream (KENALOG) 0.1 % Apply 1 application topically 2 (two) times daily.   No facility-administered medications prior to visit.    Review of Systems  Constitutional: Negative.   Respiratory: Negative.    Cardiovascular: Negative.   Psychiatric/Behavioral: Positive for sleep disturbance. Negative for decreased concentration and dysphoric mood. The patient is not nervous/anxious and is not hyperactive.        Feels very sluggish, sleeping too much    Last CBC Lab Results  Component Value Date   WBC 9.5 10/08/2015   HGB 15.4 10/08/2015   HCT 45.3 10/08/2015   MCV 87.5 10/08/2015   MCH 29.7 10/08/2015   RDW 13.0 10/08/2015   PLT 201 10/08/2015   Last metabolic panel Lab Results  Component Value Date   GLUCOSE 104 (H) 10/08/2015   NA 139 10/08/2015   K 3.7 10/08/2015   CL 106 10/08/2015   CO2 18 (L) 10/08/2015   BUN 9 10/08/2015   CREATININE 1.12 10/08/2015   GFRNONAA >60 10/08/2015   GFRAA >60 10/08/2015   CALCIUM 8.9 10/08/2015   ANIONGAP 15 10/08/2015      Objective    There were no vitals taken for this visit. BP Readings from Last 3 Encounters:  02/25/20 120/70  10/08/15 126/76   Wt Readings from Last 3 Encounters:  02/25/20 219 lb (99.3 kg)  10/08/15 260 lb (117.9 kg) (>99 %, Z= 2.60)*   * Growth percentiles are based on CDC (Boys, 2-20 Years) data.      Physical Exam Vitals reviewed.  Constitutional:      General: He is not in acute distress.    Appearance: He is well-developed.  HENT:     Head: Normocephalic and atraumatic.  Eyes:     Conjunctiva/sclera: Conjunctivae normal.  Pulmonary:     Effort: Pulmonary effort is normal. No respiratory distress.  Musculoskeletal:     Cervical back: Normal range of motion and neck supple.  Psychiatric:        Attention and Perception: Attention and perception normal.        Mood and Affect: Mood normal. Affect is flat.        Speech: Speech normal.        Behavior: Behavior normal. Behavior is cooperative.        Thought Content: Thought content normal.        Cognition and Memory: Cognition and memory normal.        Judgment: Judgment normal.      No results found for any visits on 02/25/20.   Assessment & Plan     1. Mania (HCC) Secondary to lack of sleep. Had been stressed at work and was not sleeping well. Now feels more stable and mother agrees she feels he is doing well and thinks he could taper down medications. Will decrease Zyprexa to 10mg  from 15mg . Will decrease Lithium from ER 900mg  at bedtime to ER450mg  at bedtime. Will also decrease Melatonin from 20mg  to 10mg . He is to f/u in 2 weeks. Continue follow up appts with counseling/therapist. Keep appt with psychiatry in Nov. Call if symptoms worsen in the meantime.    No follow-ups on file.      , PA-C, have reviewed all documentation for this visit. The documentation on 02/25/20 for the exam, diagnosis, procedures, and orders are all accurate and complete.    [facility] [phone] (phone) [phone] (fax)  [facility]

## 2020-02-25 NOTE — Patient Instructions (Signed)
Decrease lithium to 450mg  once at bedtime Decrease zyprexa to 10mg  from 15mg  Decrease Melatonin to 10mg  at bedtime

## 2020-03-08 ENCOUNTER — Ambulatory Visit (INDEPENDENT_AMBULATORY_CARE_PROVIDER_SITE_OTHER): Payer: Self-pay | Admitting: Physician Assistant

## 2020-03-08 ENCOUNTER — Encounter: Payer: Self-pay | Admitting: Physician Assistant

## 2020-03-08 ENCOUNTER — Other Ambulatory Visit: Payer: Self-pay

## 2020-03-08 VITALS — BP 113/62 | HR 73 | Temp 98.8°F | Ht 72.0 in | Wt 214.0 lb

## 2020-03-08 DIAGNOSIS — F309 Manic episode, unspecified: Secondary | ICD-10-CM

## 2020-03-08 MED ORDER — OLANZAPINE 5 MG PO TABS: 5.0000 mg | ORAL_TABLET | Freq: Every day | ORAL | 0 refills | Status: DC

## 2020-03-08 MED ORDER — LITHIUM CARBONATE 300 MG PO TABS
300.0000 mg | ORAL_TABLET | Freq: Every day | ORAL | 0 refills | Status: DC
Start: 2020-03-08 — End: 2020-04-08

## 2020-03-08 NOTE — Patient Instructions (Addendum)
Zyprexa decrease from 10mg  to 5mg  for 1 week, then decrease to 1/2 tab (2.5mg ) x 1 week then stop.  Lithium decrease from 450mg  to 300mg . Take 300mg  for one week, then decrease to 1/2 tab (150mg ) for one week, then decrease to 1/2 tab every other night x 1 week, then stop.

## 2020-03-08 NOTE — Progress Notes (Signed)
Established patient visit   Patient: Bradley Webb   DOB: 1995-09-03   24 y.o. Male  MRN: [id number] Visit Date: 03/08/2020  Today's healthcare provider: Margaretann Loveless, PA-C   Chief Complaint  Patient presents with  . Mania   Subjective    HPI  Follow up for mania  The patient was last seen for this 2 weeks ago. Changes made at last visit include decreasing Zyprexa to 10mg , decreasing Lithium to 450mg , and decreasing Melatonin to 10mg .  He reports good compliance with treatment. He feels that condition is Improved. He is not having side effects.   Patient reports he is continuing to do well. Denies any sleeping issues. Wants to continue decreasing medications to get off medications completely.    Patient Active Problem List   Diagnosis Date Noted  . Mania (HCC) 02/25/2020  . Essential hypertension 03/19/2018  . Slow transit constipation 02/06/2018  . Migraine with aura and without status migrainosus, not intractable 02/06/2018  . Environmental allergies 02/06/2018  . Anxiety 02/06/2018   Past Medical History:  Diagnosis Date  . Allergy   . Anxiety        Medications: Outpatient Medications Prior to Visit  Medication Sig  . docusate sodium (COLACE) 100 MG capsule Take by mouth.  . lithium carbonate (ESKALITH) 450 MG CR tablet Take 1 tablet (450 mg total) by mouth at bedtime.  . Melatonin 10 MG TABS Take 10 mg by mouth at bedtime.  02/08/2018 OLANZapine (ZYPREXA) 10 MG tablet Take 1 tablet (10 mg total) by mouth at bedtime.  . triamcinolone cream (KENALOG) 0.1 % Apply 1 application topically 2 (two) times daily.   No facility-administered medications prior to visit.    Review of Systems  Constitutional: Negative.   Respiratory: Negative.   Cardiovascular: Negative.   Neurological: Negative.   Psychiatric/Behavioral: Negative.     Last CBC Lab Results  Component Value Date   WBC 9.5 10/08/2015   HGB 15.4 10/08/2015   HCT 45.3 10/08/2015   MCV  87.5 10/08/2015   MCH 29.7 10/08/2015   RDW 13.0 10/08/2015   PLT 201 10/08/2015   Last metabolic panel Lab Results  Component Value Date   GLUCOSE 104 (H) 10/08/2015   NA 139 10/08/2015   K 3.7 10/08/2015   CL 106 10/08/2015   CO2 18 (L) 10/08/2015   BUN 9 10/08/2015   CREATININE 1.12 10/08/2015   GFRNONAA >60 10/08/2015   GFRAA >60 10/08/2015   CALCIUM 8.9 10/08/2015   ANIONGAP 15 10/08/2015      Objective    BP 113/62   Pulse 73   Temp 98.8 F (37.1 C)   Ht 6' (1.829 m)   Wt 214 lb (97.1 kg)   BMI 29.02 kg/m  BP Readings from Last 3 Encounters:  03/08/20 113/62  02/25/20 120/70  10/08/15 126/76   Wt Readings from Last 3 Encounters:  03/08/20 214 lb (97.1 kg)  02/25/20 219 lb (99.3 kg)  10/08/15 260 lb (117.9 kg) (>99 %, Z= 2.60)*   * Growth percentiles are based on CDC (Boys, 2-20 Years) data.      Physical Exam Vitals reviewed.  Constitutional:      General: He is not in acute distress.    Appearance: Normal appearance. He is well-developed, well-groomed and overweight. He is not diaphoretic.  HENT:     Head: Normocephalic and atraumatic.  Cardiovascular:     Rate and Rhythm: Normal rate and regular rhythm.  Heart sounds: Normal heart sounds. No murmur heard.  No friction rub. No gallop.   Pulmonary:     Effort: Pulmonary effort is normal. No respiratory distress.     Breath sounds: Normal breath sounds. No wheezing or rales.  Musculoskeletal:     Cervical back: Normal range of motion and neck supple.  Neurological:     Mental Status: He is alert.  Psychiatric:        Behavior: Behavior is cooperative.       No results found for any visits on 03/08/20.  Assessment & Plan     1. Mania (HCC) Suspected to be from lack of sleep from stress from work, not true mania. Will continue decreasing medications as below:  Zyprexa decrease from 10mg  to 5mg  for 1 week, then decrease to 1/2 tab (2.5mg ) x 1 week then stop.  Lithium decrease from  450mg  to 300mg . Take 300mg  for one week, then decrease to 1/2 tab (150mg ) for one week, then decrease to 1/2 tab every other night x 1 week, then stop.   I will see him back in 4 weeks to see how is adjusting without medications. Will continue Melatonin 10mg  prn for sleep.   No follow-ups on file.      , PA-C, have reviewed all documentation for this visit. The documentation on 03/08/20 for the exam, diagnosis, procedures, and orders are all accurate and complete.    [facility] [phone] (phone) [phone] (fax)  [facility]

## 2020-04-07 ENCOUNTER — Other Ambulatory Visit: Payer: Self-pay

## 2020-04-07 ENCOUNTER — Encounter: Payer: Self-pay | Admitting: Physician Assistant

## 2020-04-07 ENCOUNTER — Ambulatory Visit (INDEPENDENT_AMBULATORY_CARE_PROVIDER_SITE_OTHER): Payer: Self-pay | Admitting: Physician Assistant

## 2020-04-07 VITALS — BP 121/76 | HR 83 | Temp 99.0°F | Wt 225.0 lb

## 2020-04-07 DIAGNOSIS — F23 Brief psychotic disorder: Secondary | ICD-10-CM

## 2020-04-07 NOTE — Progress Notes (Signed)
Established patient visit   Patient: Bradley Webb   DOB: 22-Oct-1995   24 y.o. Male  MRN: [id number] Visit Date: 04/07/2020  Today's healthcare provider: Margaretann Loveless, PA-C   No chief complaint on file.  Subjective    HPI  Follow up for Mania  The patient was last seen for this 4 weeks ago. Changes made at last visit include Zyprexa decrease from 10mg  to 5mg  for 1 week, then decrease to 1/2 tab x 1 week then stop. Lithium decrease fom450mg  to 300mg . Take 300 mg for one week, then decrease to 1/2 tab for one week, then decrease to 1/2 tab every other night x 1 week, then stop. Will see him back in 4 weeks to see how he is adjusting without medications. Continue Melatonin 10mg  prn for sleep.  He reports excellent compliance with treatment. He feels that condition is Improved. He is not having side effects.   He is completely off all medications at this time. He has not even needed the melatonin for sleep. Reports he is doing very well, no issues sleeping. No hallucinations.  -----------------------------------------------------------------------------------------   Patient Active Problem List   Diagnosis Date Noted  . Mania (HCC) 02/25/2020  . Essential hypertension 03/19/2018  . Slow transit constipation 02/06/2018  . Migraine with aura and without status migrainosus, not intractable 02/06/2018  . Environmental allergies 02/06/2018  . Anxiety 02/06/2018   Past Surgical History:  Procedure Laterality Date  . INNER EAR SURGERY     Social History   Tobacco Use  . Smoking status: Current Every Day Smoker    Packs/day: 0.50    Types: Cigarettes  . Smokeless tobacco: Never Used  Vaping Use  . Vaping Use: Never used  Substance Use Topics  . Alcohol use: Yes    Alcohol/week: 9.0 standard drinks    Types: 9 Cans of beer per week  . Drug use: Yes   Allergies  Allergen Reactions  . Codeine     Other reaction(s): Rash-Allergy     Medications: Outpatient  Medications Prior to Visit  Medication Sig  . docusate sodium (COLACE) 100 MG capsule Take by mouth.  . lithium 300 MG tablet Take 1 tablet (300 mg total) by mouth at bedtime.  02/08/2018 OLANZapine (ZYPREXA) 5 MG tablet Take 1 tablet (5 mg total) by mouth at bedtime.  . triamcinolone cream (KENALOG) 0.1 % Apply 1 application topically 2 (two) times daily.   No facility-administered medications prior to visit.    Review of Systems  Constitutional: Negative.   Respiratory: Negative.   Cardiovascular: Negative.   Gastrointestinal: Negative.   Neurological: Negative for dizziness, light-headedness and headaches.  Psychiatric/Behavioral: Negative for behavioral problems, decreased concentration, dysphoric mood, self-injury, sleep disturbance and suicidal ideas. The patient is not nervous/anxious.       Objective    BP 121/76 (BP Location: Right Arm, Patient Position: Sitting, Cuff Size: Large)   Pulse 83   Temp 99 F (37.2 C) (Oral)   Wt 225 lb (102.1 kg)   SpO2 99%   BMI 30.52 kg/m    Physical Exam Vitals reviewed.  Constitutional:      General: He is not in acute distress.    Appearance: Normal appearance. He is well-developed. He is not diaphoretic.  HENT:     Head: Normocephalic and atraumatic.  Cardiovascular:     Rate and Rhythm: Normal rate and regular rhythm.     Heart sounds: Normal heart sounds. No murmur heard.  No  friction rub. No gallop.   Pulmonary:     Effort: Pulmonary effort is normal. No respiratory distress.     Breath sounds: Normal breath sounds. No wheezing or rales.  Musculoskeletal:     Cervical back: Normal range of motion and neck supple.  Neurological:     Mental Status: He is alert.  Psychiatric:        Mood and Affect: Mood normal.        Behavior: Behavior normal.        Thought Content: Thought content normal.        Judgment: Judgment normal.      No results found for any visits on 04/07/20.  Assessment & Plan     1. Acute psychosis  (HCC) Currently resolved. Had been secondary to severe insomnia. All medications have been discontinued and patient reports he is doing wonderful. Feels much better.    No follow-ups on file.      Delmer Islam, PA-C, have reviewed all documentation for this visit. The documentation on 04/08/20 for the exam, diagnosis, procedures, and orders are all accurate and complete.   Reine Just  [facility] [phone] (phone) [phone] (fax)  [facility]

## 2020-04-08 ENCOUNTER — Encounter: Payer: Self-pay | Admitting: Physician Assistant

## 2020-04-08 DIAGNOSIS — Z8659 Personal history of other mental and behavioral disorders: Secondary | ICD-10-CM | POA: Insufficient documentation

## 2021-02-22 ENCOUNTER — Encounter: Payer: Self-pay | Admitting: Family Medicine

## 2021-02-22 ENCOUNTER — Other Ambulatory Visit: Payer: Self-pay

## 2021-02-22 ENCOUNTER — Ambulatory Visit (INDEPENDENT_AMBULATORY_CARE_PROVIDER_SITE_OTHER): Payer: Self-pay | Admitting: Family Medicine

## 2021-02-22 VITALS — BP 129/87 | HR 80 | Temp 97.9°F | Resp 16 | Wt 211.0 lb

## 2021-02-22 DIAGNOSIS — H60501 Unspecified acute noninfective otitis externa, right ear: Secondary | ICD-10-CM

## 2021-02-22 DIAGNOSIS — H66001 Acute suppurative otitis media without spontaneous rupture of ear drum, right ear: Secondary | ICD-10-CM

## 2021-02-22 MED ORDER — AMOXICILLIN 875 MG PO TABS: 875.0000 mg | ORAL_TABLET | Freq: Two times a day (BID) | ORAL | 0 refills | Status: AC

## 2021-02-22 NOTE — Progress Notes (Signed)
      Established patient visit   Patient: Bradley Webb   DOB: 22-Oct-1995   25 y.o. Male  MRN: [id number] Visit Date: 02/22/2021  Today's healthcare provider: Mila Merry, MD   Chief Complaint  Patient presents with   Ear Pain   Subjective  -------------------------------------------------------------------------------------------------------------------- Otalgia  There is pain in the left ear. This is a new problem. Episode onset: 1 week ago after getting water in his ear during a shower. The problem has been gradually worsening. There has been no fever. Associated symptoms include ear discharge (left ear). Pertinent negatives include no abdominal pain or vomiting. Treatments tried: Sudafed and swimmers ear drops. His past medical history is significant for a tympanostomy tube.       Medications: Outpatient Medications Prior to Visit  Medication Sig   docusate sodium (COLACE) 100 MG capsule Take by mouth.   triamcinolone cream (KENALOG) 0.1 % Apply 1 application topically 2 (two) times daily.   No facility-administered medications prior to visit.    Review of Systems  Constitutional:  Negative for appetite change, chills and fever.  HENT:  Positive for ear discharge (left ear) and ear pain.        Loss of taste, decrease hearing  Respiratory:  Negative for chest tightness, shortness of breath and wheezing.   Cardiovascular:  Negative for chest pain and palpitations.  Gastrointestinal:  Negative for abdominal pain, nausea and vomiting.      Objective  -------------------------------------------------------------------------------------------------------------------- BP 129/87 (BP Location: Left Arm, Patient Position: Sitting, Cuff Size: Large)   Pulse 80   Temp 97.9 F (36.6 C) (Temporal)   Resp 16   Wt 211 lb (95.7 kg)   BMI 28.62 kg/m  {Show previous vital signs (optional):23777}  Physical Exam  Left Tm DULL with moderate fluid. Mildly inflamed around ear  canal    Assessment & Plan  ---------------------------------------------------------------------------------------------------------------------- 1. Acute otitis externa of right ear, unspecified type He states he has trouble with ear drops draining into throat due to history of perforated ear drop. Will try oral antibiotic first.  - amoxicillin (AMOXIL) 875 MG tablet; Take 1 tablet (875 mg total) by mouth 2 (two) times daily for 10 days.  Dispense: 20 tablet; Refill: 0  2. Acute suppurative otitis media of right ear without spontaneous rupture of tympanic membrane, recurrence not specified  - amoxicillin (AMOXIL) 875 MG tablet; Take 1 tablet (875 mg total) by mouth 2 (two) times daily for 10 days.  Dispense: 20 tablet; Refill: 0        The entirety of the information documented in the History of Present Illness, Review of Systems and Physical Exam were personally obtained by me. Portions of this information were initially documented by the CMA and reviewed by me for thoroughness and accuracy.     Mila Merry, MD  [facility] [phone] (phone) [phone] (fax)  [facility]

## 2021-03-09 ENCOUNTER — Ambulatory Visit: Payer: Self-pay | Admitting: *Deleted

## 2021-03-09 DIAGNOSIS — H60501 Unspecified acute noninfective otitis externa, right ear: Secondary | ICD-10-CM

## 2021-03-09 NOTE — Telephone Encounter (Signed)
Per agent: "Mom states pt's ear is still draining.  She said Dr Sherrie Mustache told her if it was still bothering him, he would call ear drops.  She states it is still bothering him.  She would like the drops called into their pharmacy.  [id number] - [place], [place] - [address] with pt's mother. Would like eardrops called in as discussed at visit. [facility] [address]. Reason for Disposition  Prescription request for new medicine (not a refill)  Answer Assessment - Initial Assessment Questions 1. NAME of MEDICATION: "What medicine are you calling about?"     Ear drops 2. QUESTION: "What is your question?" (e.g., double dose of medicine, side effect)     Would like called in 3. PRESCRIBING HCP: "Who prescribed it?" Reason: if prescribed by specialist, call should be referred to that group.     *No Answer* 4. SYMPTOMS: "Do you have any symptoms?"     *No Answer* 5. SEVERITY: If symptoms are present, ask "Are they mild, moderate or severe?"     *No Answer* 6. PREGNANCY:  "Is there any chance that you are pregnant?" "When was your last menstrual period?"     *No Answer*  Protocols used: Medication Question Call-A-AH

## 2021-03-10 ENCOUNTER — Ambulatory Visit: Payer: Self-pay | Admitting: *Deleted

## 2021-03-10 MED ORDER — NEOMYCIN-POLYMYXIN-HC 3.5-10000-1 OT SOLN: 3.0000 [drp] | Freq: Four times a day (QID) | OTIC | 0 refills | Status: AC

## 2021-03-10 NOTE — Addendum Note (Signed)
Addended by: Malva Limes on: 03/10/2021 11:45 AM   Modules accepted: Orders

## 2021-03-10 NOTE — Telephone Encounter (Signed)
Dr. Sherrie Mustache called in ear drops this morning to [facility] on [address]. Laverle Patter replies much thanks.    Reason for Disposition . [1] Prescription not at pharmacy AND [2] was prescribed by PCP recently (Exception: triager has access to EMR and prescription is recorded there. Go to Home Care and confirm for pharmacy.)  Answer Assessment - Initial Assessment Questions 1. NAME of MEDICATION: "What medicine are you calling about?"     Ear drops 2. QUESTION: "What is your question?" (e.g., double dose of medicine, side effect)     Please call in as we discussed at the recent visit 3. PRESCRIBING HCP: "Who prescribed it?" Reason: if prescribed by specialist, call should be referred to that group.     Dr. Sherrie Mustache 4. SYMPTOMS: "Do you have any symptoms?"     Right ear continues to drain pus-like fluid 5. SEVERITY: If symptoms are present, ask "Are they mild, moderate or severe?"     na 6. PREGNANCY:  "Is there any chance that you are pregnant?" "When was your last menstrual period?"     na  Protocols used: Medication Question Call-A-AH

## 2021-04-06 ENCOUNTER — Ambulatory Visit: Payer: Medicaid Other | Admitting: Family Medicine
